# Patient Record
Sex: Male | Born: 1950 | Race: White | Hispanic: No | Marital: Married
Health system: Southern US, Community
[De-identification: ages and names within clinical notes are randomized; demographics above are authoritative.]

## PROBLEM LIST (undated history)

## (undated) DIAGNOSIS — B1920 Unspecified viral hepatitis C without hepatic coma: Secondary | ICD-10-CM

## (undated) DIAGNOSIS — K8020 Calculus of gallbladder without cholecystitis without obstruction: Secondary | ICD-10-CM

## (undated) HISTORY — PX: HERNIA REPAIR: SHX51

---

## 2009-12-15 ENCOUNTER — Emergency Department (HOSPITAL_COMMUNITY): Admission: EM | Admit: 2009-12-15 | Discharge: 2009-12-15 | Payer: Self-pay | Admitting: Emergency Medicine

## 2010-10-30 ENCOUNTER — Ambulatory Visit: Payer: Self-pay | Admitting: Pain Medicine

## 2010-11-21 ENCOUNTER — Ambulatory Visit: Payer: Self-pay | Admitting: Pain Medicine

## 2012-01-23 ENCOUNTER — Ambulatory Visit: Payer: Self-pay | Admitting: Family Medicine

## 2012-02-06 ENCOUNTER — Ambulatory Visit: Payer: Self-pay | Admitting: Family Medicine

## 2012-03-29 ENCOUNTER — Ambulatory Visit: Payer: Self-pay | Admitting: Family Medicine

## 2012-04-29 ENCOUNTER — Ambulatory Visit: Payer: Self-pay | Admitting: Family Medicine

## 2014-09-13 ENCOUNTER — Emergency Department (HOSPITAL_COMMUNITY): Payer: Medicare HMO

## 2014-09-13 ENCOUNTER — Encounter (HOSPITAL_COMMUNITY): Payer: Self-pay | Admitting: Emergency Medicine

## 2014-09-13 ENCOUNTER — Emergency Department (HOSPITAL_COMMUNITY)
Admission: EM | Admit: 2014-09-13 | Discharge: 2014-09-13 | Disposition: A | Discharge status: Home / Self Care | Payer: Medicare HMO | Attending: Emergency Medicine | Admitting: Emergency Medicine

## 2014-09-13 DIAGNOSIS — R112 Nausea with vomiting, unspecified: Secondary | ICD-10-CM | POA: Diagnosis not present

## 2014-09-13 DIAGNOSIS — Z8719 Personal history of other diseases of the digestive system: Secondary | ICD-10-CM | POA: Diagnosis not present

## 2014-09-13 DIAGNOSIS — Z720 Tobacco use: Secondary | ICD-10-CM | POA: Insufficient documentation

## 2014-09-13 DIAGNOSIS — R1031 Right lower quadrant pain: Secondary | ICD-10-CM

## 2014-09-13 DIAGNOSIS — R110 Nausea: Secondary | ICD-10-CM

## 2014-09-13 DIAGNOSIS — Z8619 Personal history of other infectious and parasitic diseases: Secondary | ICD-10-CM | POA: Diagnosis not present

## 2014-09-13 DIAGNOSIS — M549 Dorsalgia, unspecified: Secondary | ICD-10-CM | POA: Diagnosis not present

## 2014-09-13 DIAGNOSIS — Z79899 Other long term (current) drug therapy: Secondary | ICD-10-CM | POA: Diagnosis not present

## 2014-09-13 DIAGNOSIS — R000 Tachycardia, unspecified: Secondary | ICD-10-CM | POA: Diagnosis not present

## 2014-09-13 HISTORY — DX: Unspecified viral hepatitis C without hepatic coma: B19.20

## 2014-09-13 HISTORY — DX: Calculus of gallbladder without cholecystitis without obstruction: K80.20

## 2014-09-13 LAB — COMPREHENSIVE METABOLIC PANEL
ALK PHOS: 137 U/L — AB (ref 38–126)
ALT: 65 U/L — ABNORMAL HIGH (ref 17–63)
ANION GAP: 11 (ref 5–15)
AST: 73 U/L — ABNORMAL HIGH (ref 15–41)
Albumin: 4.2 g/dL (ref 3.5–5.0)
BILIRUBIN TOTAL: 1.9 mg/dL — AB (ref 0.3–1.2)
BUN: 11 mg/dL (ref 6–20)
CHLORIDE: 99 mmol/L — AB (ref 101–111)
CO2: 25 mmol/L (ref 22–32)
CREATININE: 1.01 mg/dL (ref 0.61–1.24)
Calcium: 9.2 mg/dL (ref 8.9–10.3)
GFR calc Af Amer: >60 mL/min (ref >60)
GLUCOSE: 140 mg/dL — AB (ref 65–99)
POTASSIUM: 3.8 mmol/L (ref 3.5–5.1)
Sodium: 135 mmol/L (ref 135–145)
TOTAL PROTEIN: 7.2 g/dL (ref 6.5–8.1)

## 2014-09-13 LAB — CBC WITH DIFFERENTIAL/PLATELET
Basophils Absolute: 0 10*3/uL (ref 0.0–0.1)
Basophils Relative: 0 % (ref 0–1)
EOS PCT: 0 % (ref 0–5)
Eosinophils Absolute: 0 10*3/uL (ref 0.0–0.7)
HEMATOCRIT: 46.6 % (ref 39.0–52.0)
HEMOGLOBIN: 16.5 g/dL (ref 13.0–17.0)
LYMPHS ABS: 3.4 10*3/uL (ref 0.7–4.0)
Lymphocytes Relative: 28 % (ref 12–46)
MCH: 33.1 pg (ref 26.0–34.0)
MCHC: 35.4 g/dL (ref 30.0–36.0)
MCV: 93.6 fL (ref 78.0–100.0)
MONO ABS: 1.1 10*3/uL — AB (ref 0.1–1.0)
Monocytes Relative: 9 % (ref 3–12)
NEUTROS PCT: 63 % (ref 43–77)
Neutro Abs: 7.6 10*3/uL (ref 1.7–7.7)
Platelets: 160 10*3/uL (ref 150–400)
RBC: 4.98 MIL/uL (ref 4.22–5.81)
RDW: 12.7 % (ref 11.5–15.5)
WBC: 12.1 10*3/uL — AB (ref 4.0–10.5)

## 2014-09-13 LAB — URINALYSIS, ROUTINE W REFLEX MICROSCOPIC
Glucose, UA: NEGATIVE mg/dL
Hgb urine dipstick: NEGATIVE
KETONES UR: 15 mg/dL — AB
Leukocytes, UA: NEGATIVE
NITRITE: NEGATIVE
PROTEIN: NEGATIVE mg/dL
Specific Gravity, Urine: 1.028 (ref 1.005–1.030)
Urobilinogen, UA: 2 mg/dL — ABNORMAL HIGH (ref 0.0–1.0)
pH: 6 (ref 5.0–8.0)

## 2014-09-13 LAB — LIPASE, BLOOD: LIPASE: 37 U/L (ref 22–51)

## 2014-09-13 MED ORDER — IOHEXOL 300 MG/ML  SOLN
25.0000 mL | Freq: Once | INTRAMUSCULAR | Status: AC | PRN
  Administered 2014-09-13: 25 mL via ORAL

## 2014-09-13 MED ORDER — HYDROMORPHONE HCL 1 MG/ML IJ SOLN
1.0000 mg | Freq: Once | INTRAMUSCULAR | Status: AC
  Administered 2014-09-13: 1 mg via INTRAVENOUS
  Filled 2014-09-13: qty 1

## 2014-09-13 MED ORDER — HYDROMORPHONE HCL 1 MG/ML IJ SOLN
1.0000 mg | Freq: Once | INTRAMUSCULAR | Status: DC
Start: 2014-09-13 — End: 2014-09-13

## 2014-09-13 MED ORDER — ONDANSETRON HCL 4 MG/2ML IJ SOLN
4.0000 mg | Freq: Once | INTRAMUSCULAR | Status: AC
  Administered 2014-09-13: 4 mg via INTRAVENOUS
  Filled 2014-09-13: qty 2

## 2014-09-13 MED ORDER — MORPHINE SULFATE 4 MG/ML IJ SOLN
4.0000 mg | Freq: Once | INTRAMUSCULAR | Status: AC
  Administered 2014-09-13: 4 mg via INTRAVENOUS
  Filled 2014-09-13: qty 1

## 2014-09-13 MED ORDER — OXYCODONE-ACETAMINOPHEN 5-325 MG PO TABS: 2.0000 | ORAL_TABLET | ORAL | Status: AC | PRN

## 2014-09-13 MED ORDER — OXYCODONE-ACETAMINOPHEN 5-325 MG PO TABS
2.0000 | ORAL_TABLET | Freq: Once | ORAL | Status: AC
  Administered 2014-09-13: 2 via ORAL
  Filled 2014-09-13: qty 2

## 2014-09-13 MED ORDER — ONDANSETRON 4 MG PO TBDP: 4.0000 mg | ORAL_TABLET | Freq: Three times a day (TID) | ORAL | Status: AC | PRN

## 2014-09-13 MED ORDER — SODIUM CHLORIDE 0.9 % IV BOLUS (SEPSIS)
1000.0000 mL | Freq: Once | INTRAVENOUS | Status: AC
  Administered 2014-09-13: 1000 mL via INTRAVENOUS

## 2014-09-13 MED ORDER — IOHEXOL 300 MG/ML  SOLN
100.0000 mL | Freq: Once | INTRAMUSCULAR | Status: AC | PRN
  Administered 2014-09-13: 100 mL via INTRAVENOUS

## 2014-09-13 NOTE — Discharge Instructions (Signed)
Take Percocet as needed for pain. Take zofran as needed for nausea. Follow up with your doctor for further evaluation. Return to the ED with worsening or concerning symptoms.

## 2014-09-13 NOTE — ED Provider Notes (Signed)
CSN: [id number]     Arrival date & time 09/13/14  0506 History   First MD Initiated Contact with Patient 09/13/14 0602     Chief Complaint  Patient presents with  . Abdominal Pain     (Consider location/radiation/quality/duration/timing/severity/associated sxs/prior Treatment) HPI Comments: Patient is a 64 year old male with a past medical history of hepatitis C and gallstones who presents with abdominal pain for the past 3 days. The pain is located in the RLQ and radiates to his right low back. The pain is described as aching and severe. The pain started gradually and progressively worsened since the onset. No alleviating/aggravating factors. The patient has tried nothing for symptoms without relief. Associated symptoms include nausea and vomiting. Patient denies headache, diarrhea, chest pain, SOB, dysuria, constipation. Previous abdominal surgery includes hernia repair.     Past Medical History  Diagnosis Date  . Hepatitis C   . Gallstones    Past Surgical History  Procedure Laterality Date  . Hernia repair     No family history on file. History  Substance Use Topics  . Smoking status: Current Every Day Smoker  . Smokeless tobacco: Not on file  . Alcohol Use: No    Review of Systems  Constitutional: Negative for fever, chills and fatigue.  HENT: Negative for trouble swallowing.   Eyes: Negative for visual disturbance.  Respiratory: Negative for shortness of breath.   Cardiovascular: Negative for chest pain and palpitations.  Gastrointestinal: Positive for nausea, vomiting and abdominal pain. Negative for diarrhea.  Genitourinary: Negative for dysuria and difficulty urinating.  Musculoskeletal: Positive for back pain. Negative for arthralgias and neck pain.  Skin: Negative for color change.  Neurological: Negative for dizziness and weakness.  Psychiatric/Behavioral: Negative for dysphoric mood.      Allergies  Review of patient's allergies indicates no known  allergies.  Home Medications   Prior to Admission medications   Medication Sig Start Date End Date Taking? Authorizing Provider  lactulose (CHRONULAC) 10 GM/15ML solution Take 20 g by mouth 2 (two) times daily.   Yes Historical Provider, MD   BP 195/89 mmHg  Pulse 105  Temp(Src) 98.2 F (36.8 C) (Oral)  Resp 20  SpO2 94% Physical Exam  Constitutional: He is oriented to person, place, and time. He appears well-developed and well-nourished. No distress.  HENT:  Head: Normocephalic and atraumatic.  Eyes: Conjunctivae and EOM are normal.  Neck: Normal range of motion.  Cardiovascular: Regular rhythm.  Exam reveals no gallop and no friction rub.   No murmur heard. tachycardic  Pulmonary/Chest: Effort normal and breath sounds normal. He has no wheezes. He has no rales. He exhibits no tenderness.  Abdominal: Soft. He exhibits no distension. There is tenderness. There is no rebound.  RLQ tenderness to palpation. No peritoneal signs. No other focal tenderness to palpation.   Musculoskeletal: Normal range of motion.  Neurological: He is alert and oriented to person, place, and time. Coordination normal.  Speech is goal-oriented. Moves limbs without ataxia.   Skin: Skin is warm and dry.  Psychiatric: He has a normal mood and affect. His behavior is normal.  Nursing note and vitals reviewed.   ED Course  Procedures (including critical care time) Labs Review Labs Reviewed  CBC WITH DIFFERENTIAL/PLATELET - Abnormal; Notable for the following:    WBC 12.1 (*)    Monocytes Absolute 1.1 (*)    All other components within normal limits  COMPREHENSIVE METABOLIC PANEL - Abnormal; Notable for the following:    Chloride  99 (*)    Glucose, Bld 140 (*)    AST 73 (*)    ALT 65 (*)    Alkaline Phosphatase 137 (*)    Total Bilirubin 1.9 (*)    All other components within normal limits  URINALYSIS, ROUTINE W REFLEX MICROSCOPIC - Abnormal; Notable for the following:    Color, Urine AMBER (*)     Bilirubin Urine SMALL (*)    Ketones, ur 15 (*)    Urobilinogen, UA 2.0 (*)    All other components within normal limits  LIPASE, BLOOD    Imaging Review Ct Abdomen Pelvis W Contrast  09/13/2014   CLINICAL DATA:  Low abdominal pain/low back pain.  Right flank pain  EXAM: CT ABDOMEN AND PELVIS WITH CONTRAST  TECHNIQUE: Multidetector CT imaging of the abdomen and pelvis was performed using the standard protocol following bolus administration of intravenous contrast.  CONTRAST:  100mL OMNIPAQUE IOHEXOL 300 MG/ML  SOLN  COMPARISON:  None.  FINDINGS: Lower chest: The lung bases are clear. There is no pleural or pericardial effusion identified.  Hepatobiliary: No focal liver abnormality identified. There is mild to moderate intrahepatic and extrahepatic bile duct dilatation. The common bile duct measures 11 mm. No obstructing stone identified.  Pancreas: Increase caliber of the pancreatic duct which measures up to 6 mm, image 31/ series 201. Soft tissue attenuating structure at the level of the ampulla is identified measuring 1.2 x 1.7 cm.  Spleen: Negative  Adrenals/Urinary Tract: Normal appearance of the adrenal glands. The right kidney is normal. There is a cyst in the left kidney which measures 2 cm, image 32 of series 201. The urinary bladder is within normal limits.  Stomach/Bowel: The stomach is within normal limits. The small bowel loops have a normal course and caliber. No obstruction. Normal appearance of the colon. The appendix is visualized and appears normal. Normal appearance of the colon.  Vascular/Lymphatic: Calcified atherosclerotic disease involves the abdominal aorta. No aneurysm. Prominent portal caval lymph node measures 1.3 cm short axis, image 26 of series 201. No pelvic or inguinal adenopathy.  Reproductive: Prostate gland and seminal vesicles are unremarkable.  Other: There is no ascites or focal fluid collections within the abdomen or pelvis.  Musculoskeletal: Review of the visualized  osseous structures is negative for aggressive lytic or sclerotic bone lesion. Degenerate degenerative disc disease is noted at the L5-S1 level.  IMPRESSION: 1. There is increase caliber of the intrahepatic and extrahepatic bile ducts. The pancreatic duct is also increased in caliber. No stone identified. Lesion at the level of the ampulla cannot be excluded. Further evaluation with contrast enhanced MRCP and/or ERCP is recommended. 2. Aortic atherosclerosis.   Electronically Signed   By: Signa Kellaylor  Stroud M.D.   On: 09/13/2014 08:23     EKG Interpretation None      MDM   Final diagnoses:  RLQ abdominal pain  Nausea   6:23 AM Labs, urinalysis, and CT abdomen pending. Patient given morphine and zofran for symptoms. Patient mildly tachycardic with remaining vitals stable. No fever.   12:19 PM No acute changes on CT. Patient discussed with Dr. Freida BusmanAllen. Patient offered admission for pain control but he declines. Patient instructed to return to the ED with worsening or concerning symptoms. Patient will be discharged with Percocet and zofran for symptoms.   Emilia BeckKaitlyn Purvis Sidle, PA-C 09/13/14 1221  Shon Batonourtney F Horton, MD 09/13/14 [phone]

## 2014-09-13 NOTE — ED Notes (Signed)
Pt. arrived with EMS from home reports low abdominal pain/low back pain with mild SOB and nausea onset 3 days ago , denies diarrhea , no fever or chills. Pt. received Fentanyl 50 mcg and Zofran 4 mg IV prior to arrival with temporary relief.

## 2016-05-05 ENCOUNTER — Encounter (INDEPENDENT_AMBULATORY_CARE_PROVIDER_SITE_OTHER): Payer: Self-pay | Admitting: Ophthalmology

## 2019-11-22 ENCOUNTER — Other Ambulatory Visit: Payer: Self-pay

## 2019-11-22 ENCOUNTER — Emergency Department (HOSPITAL_COMMUNITY): Payer: Medicare Other

## 2019-11-22 ENCOUNTER — Inpatient Hospital Stay (HOSPITAL_COMMUNITY)
Admission: EM | Admit: 2019-11-22 | Discharge: 2019-11-28 | DRG: 964 | Disposition: A | Discharge status: Home Health | Payer: Medicare Other | Attending: Emergency Medicine | Admitting: Emergency Medicine

## 2019-11-22 DIAGNOSIS — S42101A Fracture of unspecified part of scapula, right shoulder, initial encounter for closed fracture: Secondary | ICD-10-CM

## 2019-11-22 DIAGNOSIS — S2241XA Multiple fractures of ribs, right side, initial encounter for closed fracture: Secondary | ICD-10-CM

## 2019-11-22 DIAGNOSIS — S270XXA Traumatic pneumothorax, initial encounter: Secondary | ICD-10-CM

## 2019-11-22 DIAGNOSIS — J939 Pneumothorax, unspecified: Secondary | ICD-10-CM

## 2019-11-22 DIAGNOSIS — S0240CA Maxillary fracture, right side, initial encounter for closed fracture: Secondary | ICD-10-CM | POA: Diagnosis present

## 2019-11-22 DIAGNOSIS — S020XXA Fracture of vault of skull, initial encounter for closed fracture: Secondary | ICD-10-CM | POA: Diagnosis present

## 2019-11-22 DIAGNOSIS — D649 Anemia, unspecified: Secondary | ICD-10-CM | POA: Diagnosis present

## 2019-11-22 DIAGNOSIS — Z4682 Encounter for fitting and adjustment of non-vascular catheter: Secondary | ICD-10-CM

## 2019-11-22 DIAGNOSIS — D6959 Other secondary thrombocytopenia: Secondary | ICD-10-CM | POA: Diagnosis present

## 2019-11-22 DIAGNOSIS — S064X9A Epidural hemorrhage with loss of consciousness of unspecified duration, initial encounter: Secondary | ICD-10-CM | POA: Diagnosis present

## 2019-11-22 DIAGNOSIS — Z20822 Contact with and (suspected) exposure to covid-19: Secondary | ICD-10-CM | POA: Diagnosis present

## 2019-11-22 DIAGNOSIS — S0240EA Zygomatic fracture, right side, initial encounter for closed fracture: Secondary | ICD-10-CM | POA: Diagnosis present

## 2019-11-22 DIAGNOSIS — S0219XA Other fracture of base of skull, initial encounter for closed fracture: Secondary | ICD-10-CM | POA: Diagnosis present

## 2019-11-22 DIAGNOSIS — F1110 Opioid abuse, uncomplicated: Secondary | ICD-10-CM | POA: Diagnosis present

## 2019-11-22 DIAGNOSIS — G8929 Other chronic pain: Secondary | ICD-10-CM | POA: Diagnosis present

## 2019-11-22 DIAGNOSIS — R791 Abnormal coagulation profile: Secondary | ICD-10-CM | POA: Diagnosis present

## 2019-11-22 DIAGNOSIS — I10 Essential (primary) hypertension: Secondary | ICD-10-CM | POA: Diagnosis present

## 2019-11-22 DIAGNOSIS — F17210 Nicotine dependence, cigarettes, uncomplicated: Secondary | ICD-10-CM | POA: Diagnosis present

## 2019-11-22 DIAGNOSIS — R0602 Shortness of breath: Secondary | ICD-10-CM

## 2019-11-22 DIAGNOSIS — S42111A Displaced fracture of body of scapula, right shoulder, initial encounter for closed fracture: Secondary | ICD-10-CM | POA: Diagnosis present

## 2019-11-22 DIAGNOSIS — E875 Hyperkalemia: Secondary | ICD-10-CM | POA: Diagnosis present

## 2019-11-22 DIAGNOSIS — S27321A Contusion of lung, unilateral, initial encounter: Secondary | ICD-10-CM | POA: Diagnosis present

## 2019-11-22 DIAGNOSIS — S43101A Unspecified dislocation of right acromioclavicular joint, initial encounter: Secondary | ICD-10-CM | POA: Diagnosis present

## 2019-11-22 DIAGNOSIS — N179 Acute kidney failure, unspecified: Secondary | ICD-10-CM | POA: Diagnosis present

## 2019-11-22 DIAGNOSIS — S7001XA Contusion of right hip, initial encounter: Secondary | ICD-10-CM | POA: Diagnosis present

## 2019-11-22 DIAGNOSIS — K7460 Unspecified cirrhosis of liver: Secondary | ICD-10-CM | POA: Diagnosis present

## 2019-11-22 DIAGNOSIS — M25571 Pain in right ankle and joints of right foot: Secondary | ICD-10-CM

## 2019-11-22 DIAGNOSIS — Z9689 Presence of other specified functional implants: Secondary | ICD-10-CM

## 2019-11-22 LAB — I-STAT CHEM 8, ED
BUN: 45 mg/dL — ABNORMAL HIGH (ref 8–23)
Calcium, Ion: 0.99 mmol/L — ABNORMAL LOW (ref 1.15–1.40)
Chloride: 107 mmol/L (ref 98–111)
Creatinine, Ser: 1.5 mg/dL — ABNORMAL HIGH (ref 0.61–1.24)
Glucose, Bld: 134 mg/dL — ABNORMAL HIGH (ref 70–99)
HCT: 37 % — ABNORMAL LOW (ref 39.0–52.0)
Hemoglobin: 12.6 g/dL — ABNORMAL LOW (ref 13.0–17.0)
Potassium: 5.7 mmol/L — ABNORMAL HIGH (ref 3.5–5.1)
Sodium: 139 mmol/L (ref 135–145)
TCO2: 23 mmol/L (ref 22–32)

## 2019-11-22 LAB — PROTIME-INR
INR: 2.7 — ABNORMAL HIGH (ref 0.8–1.2)
Prothrombin Time: 27.6 seconds — ABNORMAL HIGH (ref 11.4–15.2)

## 2019-11-22 LAB — CBC
HCT: 38.4 % — ABNORMAL LOW (ref 39.0–52.0)
Hemoglobin: 12.7 g/dL — ABNORMAL LOW (ref 13.0–17.0)
MCH: 32.6 pg (ref 26.0–34.0)
MCHC: 33.1 g/dL (ref 30.0–36.0)
MCV: 98.5 fL (ref 80.0–100.0)
Platelets: 121 10*3/uL — ABNORMAL LOW (ref 150–400)
RBC: 3.9 MIL/uL — ABNORMAL LOW (ref 4.22–5.81)
RDW: 12.4 % (ref 11.5–15.5)
WBC: 12.2 10*3/uL — ABNORMAL HIGH (ref 4.0–10.5)
nRBC: 0 % (ref 0.0–0.2)

## 2019-11-22 LAB — SAMPLE TO BLOOD BANK

## 2019-11-22 LAB — LACTIC ACID, PLASMA: Lactic Acid, Venous: 0.6 mmol/L (ref 0.5–1.9)

## 2019-11-22 LAB — ETHANOL: Alcohol, Ethyl (B): <10 mg/dL (ref <10)

## 2019-11-22 MED ORDER — IOHEXOL 300 MG/ML  SOLN
100.0000 mL | Freq: Once | INTRAMUSCULAR | Status: AC | PRN
  Administered 2019-11-22: 100 mL via INTRAVENOUS

## 2019-11-22 MED ORDER — LIDOCAINE HCL (PF) 1 % IJ SOLN
INTRAMUSCULAR | Status: AC
  Filled 2019-11-22: qty 5

## 2019-11-22 MED ORDER — KETAMINE HCL 50 MG/5ML IJ SOSY
PREFILLED_SYRINGE | INTRAMUSCULAR | Status: AC
  Filled 2019-11-22: qty 5

## 2019-11-22 MED ORDER — HYDROMORPHONE HCL 1 MG/ML IJ SOLN
0.5000 mg | Freq: Once | INTRAMUSCULAR | Status: AC
  Administered 2019-11-22: 0.5 mg via INTRAVENOUS
  Filled 2019-11-22: qty 1
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
## 2019-11-23 ENCOUNTER — Inpatient Hospital Stay (HOSPITAL_COMMUNITY): Payer: Medicare Other

## 2019-11-23 ENCOUNTER — Emergency Department (HOSPITAL_COMMUNITY): Payer: Medicare Other

## 2019-11-23 DIAGNOSIS — K7460 Unspecified cirrhosis of liver: Secondary | ICD-10-CM | POA: Diagnosis present

## 2019-11-23 DIAGNOSIS — G8929 Other chronic pain: Secondary | ICD-10-CM | POA: Diagnosis present

## 2019-11-23 DIAGNOSIS — R791 Abnormal coagulation profile: Secondary | ICD-10-CM | POA: Diagnosis present

## 2019-11-23 DIAGNOSIS — Z20822 Contact with and (suspected) exposure to covid-19: Secondary | ICD-10-CM | POA: Diagnosis present

## 2019-11-23 DIAGNOSIS — S0240EA Zygomatic fracture, right side, initial encounter for closed fracture: Secondary | ICD-10-CM | POA: Diagnosis present

## 2019-11-23 DIAGNOSIS — F17210 Nicotine dependence, cigarettes, uncomplicated: Secondary | ICD-10-CM | POA: Diagnosis present

## 2019-11-23 DIAGNOSIS — S020XXA Fracture of vault of skull, initial encounter for closed fracture: Secondary | ICD-10-CM | POA: Diagnosis present

## 2019-11-23 DIAGNOSIS — E875 Hyperkalemia: Secondary | ICD-10-CM | POA: Diagnosis present

## 2019-11-23 DIAGNOSIS — S0240CA Maxillary fracture, right side, initial encounter for closed fracture: Secondary | ICD-10-CM | POA: Diagnosis present

## 2019-11-23 DIAGNOSIS — D649 Anemia, unspecified: Secondary | ICD-10-CM | POA: Diagnosis present

## 2019-11-23 DIAGNOSIS — S270XXA Traumatic pneumothorax, initial encounter: Secondary | ICD-10-CM | POA: Diagnosis present

## 2019-11-23 DIAGNOSIS — S2241XA Multiple fractures of ribs, right side, initial encounter for closed fracture: Secondary | ICD-10-CM | POA: Diagnosis present

## 2019-11-23 DIAGNOSIS — S43101A Unspecified dislocation of right acromioclavicular joint, initial encounter: Secondary | ICD-10-CM | POA: Diagnosis present

## 2019-11-23 DIAGNOSIS — F1110 Opioid abuse, uncomplicated: Secondary | ICD-10-CM | POA: Diagnosis present

## 2019-11-23 DIAGNOSIS — S42111A Displaced fracture of body of scapula, right shoulder, initial encounter for closed fracture: Secondary | ICD-10-CM | POA: Diagnosis present

## 2019-11-23 DIAGNOSIS — D6959 Other secondary thrombocytopenia: Secondary | ICD-10-CM | POA: Diagnosis present

## 2019-11-23 DIAGNOSIS — S27321A Contusion of lung, unilateral, initial encounter: Secondary | ICD-10-CM | POA: Diagnosis present

## 2019-11-23 DIAGNOSIS — S064X9A Epidural hemorrhage with loss of consciousness of unspecified duration, initial encounter: Secondary | ICD-10-CM | POA: Diagnosis present

## 2019-11-23 DIAGNOSIS — N179 Acute kidney failure, unspecified: Secondary | ICD-10-CM | POA: Diagnosis present

## 2019-11-23 DIAGNOSIS — S0219XA Other fracture of base of skull, initial encounter for closed fracture: Secondary | ICD-10-CM | POA: Diagnosis present

## 2019-11-23 DIAGNOSIS — S7001XA Contusion of right hip, initial encounter: Secondary | ICD-10-CM | POA: Diagnosis present

## 2019-11-23 DIAGNOSIS — I10 Essential (primary) hypertension: Secondary | ICD-10-CM | POA: Diagnosis present

## 2019-11-23 LAB — URINALYSIS, ROUTINE W REFLEX MICROSCOPIC
Bacteria, UA: NONE SEEN
Bilirubin Urine: NEGATIVE
Glucose, UA: 50 mg/dL — AB
Hgb urine dipstick: NEGATIVE
Ketones, ur: NEGATIVE mg/dL
Nitrite: NEGATIVE
Protein, ur: NEGATIVE mg/dL
Specific Gravity, Urine: >1.046 — ABNORMAL HIGH (ref 1.005–1.030)
pH: 5 (ref 5.0–8.0)

## 2019-11-23 LAB — BASIC METABOLIC PANEL
Anion gap: 11 (ref 5–15)
BUN: 30 mg/dL — ABNORMAL HIGH (ref 8–23)
CO2: 23 mmol/L (ref 22–32)
Calcium: 8.5 mg/dL — ABNORMAL LOW (ref 8.9–10.3)
Chloride: 104 mmol/L (ref 98–111)
Creatinine, Ser: 1.19 mg/dL (ref 0.61–1.24)
GFR calc Af Amer: >60 mL/min (ref >60)
GFR calc non Af Amer: >60 mL/min (ref >60)
Glucose, Bld: 152 mg/dL — ABNORMAL HIGH (ref 70–99)
Potassium: 4.5 mmol/L (ref 3.5–5.1)
Sodium: 138 mmol/L (ref 135–145)

## 2019-11-23 LAB — HEPATIC FUNCTION PANEL
ALT: 20 U/L (ref 0–44)
AST: 27 U/L (ref 15–41)
Albumin: 3.9 g/dL (ref 3.5–5.0)
Alkaline Phosphatase: 53 U/L (ref 38–126)
Bilirubin, Direct: <0.1 mg/dL (ref 0.0–0.2)
Total Bilirubin: 0.7 mg/dL (ref 0.3–1.2)
Total Protein: 6.9 g/dL (ref 6.5–8.1)

## 2019-11-23 LAB — TROPONIN I (HIGH SENSITIVITY)
Troponin I (High Sensitivity): 6 ng/L (ref <18)
Troponin I (High Sensitivity): 6 ng/L (ref <18)

## 2019-11-23 LAB — MRSA PCR SCREENING: MRSA by PCR: NEGATIVE

## 2019-11-23 LAB — RAPID URINE DRUG SCREEN, HOSP PERFORMED
Amphetamines: NOT DETECTED
Barbiturates: NOT DETECTED
Benzodiazepines: NOT DETECTED
Cocaine: NOT DETECTED
Opiates: NOT DETECTED
Tetrahydrocannabinol: NOT DETECTED

## 2019-11-23 LAB — PROTIME-INR
INR: 1.2 (ref 0.8–1.2)
Prothrombin Time: 14.6 seconds (ref 11.4–15.2)

## 2019-11-23 LAB — SARS CORONAVIRUS 2 BY RT PCR (HOSPITAL ORDER, PERFORMED IN ~~LOC~~ HOSPITAL LAB): SARS Coronavirus 2: NEGATIVE

## 2019-11-23 LAB — HIV ANTIBODY (ROUTINE TESTING W REFLEX): HIV Screen 4th Generation wRfx: NONREACTIVE

## 2019-11-23 MED ORDER — OXYCODONE HCL 5 MG PO TABS
5.0000 mg | ORAL_TABLET | ORAL | Status: DC | PRN
  Administered 2019-11-23 – 2019-11-26 (×8): 10 mg via ORAL
  Filled 2019-11-23 (×10): qty 2

## 2019-11-23 MED ORDER — VITAMIN K1 10 MG/ML IJ SOLN
1.0000 mg | Freq: Once | INTRAVENOUS | Status: AC
  Administered 2019-11-23: 1 mg via INTRAVENOUS
  Filled 2019-11-23: qty 0.1

## 2019-11-23 MED ORDER — HYDROMORPHONE HCL 1 MG/ML IJ SOLN
0.5000 mg | INTRAMUSCULAR | Status: DC | PRN
  Administered 2019-11-23 (×3): 2 mg via INTRAVENOUS
  Administered 2019-11-24: 1 mg via INTRAVENOUS
  Administered 2019-11-24 – 2019-11-26 (×9): 2 mg via INTRAVENOUS
  Filled 2019-11-23 (×3): qty 2
  Filled 2019-11-23: qty 1
  Filled 2019-11-23 (×9): qty 2

## 2019-11-23 MED ORDER — HYDROMORPHONE HCL 1 MG/ML IJ SOLN
0.5000 mg | Freq: Once | INTRAMUSCULAR | Status: AC
  Administered 2019-11-23: 0.5 mg via INTRAVENOUS
  Filled 2019-11-23: qty 1

## 2019-11-23 MED ORDER — METHOCARBAMOL 500 MG PO TABS
1000.0000 mg | ORAL_TABLET | Freq: Three times a day (TID) | ORAL | Status: DC
  Administered 2019-11-23 – 2019-11-24 (×4): 1000 mg via ORAL
  Filled 2019-11-23 (×11): qty 2

## 2019-11-23 MED ORDER — HYDROMORPHONE HCL 1 MG/ML IJ SOLN: 1.0000 mg | Freq: Once | INTRAMUSCULAR | Status: AC

## 2019-11-23 MED ORDER — ONDANSETRON 4 MG PO TBDP
4.0000 mg | ORAL_TABLET | Freq: Four times a day (QID) | ORAL | Status: DC | PRN
  Administered 2019-11-26: 4 mg via ORAL
  Filled 2019-11-23: qty 1

## 2019-11-23 MED ORDER — ACETAMINOPHEN 500 MG PO TABS
1000.0000 mg | ORAL_TABLET | Freq: Four times a day (QID) | ORAL | Status: DC
  Administered 2019-11-23: 1000 mg via ORAL
  Filled 2019-11-23 (×2): qty 2

## 2019-11-23 MED ORDER — METHADONE HCL 10 MG PO TABS
40.0000 mg | ORAL_TABLET | Freq: Every day | ORAL | Status: DC
  Administered 2019-11-23 – 2019-11-25 (×3): 40 mg via ORAL
  Filled 2019-11-23 (×2): qty 8
  Filled 2019-11-23: qty 4

## 2019-11-23 MED ORDER — TRAMADOL HCL 50 MG PO TABS
50.0000 mg | ORAL_TABLET | Freq: Four times a day (QID) | ORAL | Status: DC | PRN
  Administered 2019-11-23: 50 mg via ORAL
  Filled 2019-11-23 (×2): qty 1

## 2019-11-23 MED ORDER — DEXTROSE-NACL 5-0.9 % IV SOLN: INTRAVENOUS | Status: DC

## 2019-11-23 MED ORDER — CHLORHEXIDINE GLUCONATE CLOTH 2 % EX PADS
6.0000 | MEDICATED_PAD | Freq: Every day | CUTANEOUS | Status: DC
  Administered 2019-11-23 – 2019-11-28 (×6): 6 via TOPICAL

## 2019-11-23 MED ORDER — SODIUM CHLORIDE 0.9 % IV SOLN: 8.0000 mg | Freq: Once | INTRAVENOUS | Status: DC

## 2019-11-23 MED ORDER — ORAL CARE MOUTH RINSE
15.0000 mL | Freq: Two times a day (BID) | OROMUCOSAL | Status: DC
  Administered 2019-11-23 – 2019-11-28 (×7): 15 mL via OROMUCOSAL

## 2019-11-23 MED ORDER — LEVETIRACETAM 500 MG PO TABS
500.0000 mg | ORAL_TABLET | Freq: Two times a day (BID) | ORAL | Status: DC
  Administered 2019-11-23 – 2019-11-28 (×12): 500 mg via ORAL
  Filled 2019-11-23 (×12): qty 1

## 2019-11-23 MED ORDER — METOPROLOL TARTRATE 5 MG/5ML IV SOLN: 5.0000 mg | Freq: Four times a day (QID) | INTRAVENOUS | Status: DC | PRN

## 2019-11-23 MED ORDER — PANTOPRAZOLE SODIUM 40 MG PO TBEC: 40.0000 mg | DELAYED_RELEASE_TABLET | Freq: Every day | ORAL | Status: DC

## 2019-11-23 MED ORDER — PANTOPRAZOLE SODIUM 40 MG IV SOLR: 40.0000 mg | Freq: Every day | INTRAVENOUS | Status: DC

## 2019-11-23 MED ORDER — ONDANSETRON HCL 4 MG/2ML IJ SOLN
4.0000 mg | Freq: Four times a day (QID) | INTRAMUSCULAR | Status: DC | PRN
  Administered 2019-11-23 – 2019-11-24 (×3): 4 mg via INTRAVENOUS
  Filled 2019-11-23 (×3): qty 2

## 2019-11-23 MED ORDER — HEPARIN SODIUM (PORCINE) 5000 UNIT/ML IJ SOLN: 5000.0000 [IU] | Freq: Three times a day (TID) | INTRAMUSCULAR | Status: DC

## 2019-11-23 MED ORDER — HYDROMORPHONE HCL 1 MG/ML IJ SOLN
INTRAMUSCULAR | Status: AC
  Administered 2019-11-23: 1 mg via INTRAVENOUS
  Filled 2019-11-23: qty 1

## 2019-11-23 MED ORDER — ONDANSETRON HCL 4 MG/2ML IJ SOLN
4.0000 mg | Freq: Once | INTRAMUSCULAR | Status: AC
  Administered 2019-11-23: 4 mg via INTRAVENOUS

## 2019-11-23 MED ORDER — DOCUSATE SODIUM 100 MG PO CAPS
100.0000 mg | ORAL_CAPSULE | Freq: Two times a day (BID) | ORAL | Status: DC
  Administered 2019-11-23 – 2019-11-28 (×11): 100 mg via ORAL
  Filled 2019-11-23 (×11): qty 1

## 2019-11-23 MED ORDER — ENOXAPARIN SODIUM 30 MG/0.3ML ~~LOC~~ SOLN
30.0000 mg | Freq: Two times a day (BID) | SUBCUTANEOUS | Status: DC
  Administered 2019-11-24 – 2019-11-28 (×9): 30 mg via SUBCUTANEOUS
  Filled 2019-11-23 (×9): qty 0.3

## 2019-11-23 MED ORDER — HYDRALAZINE HCL 20 MG/ML IJ SOLN
10.0000 mg | INTRAMUSCULAR | Status: DC | PRN
  Administered 2019-11-23 – 2019-11-24 (×2): 10 mg via INTRAVENOUS
  Filled 2019-11-23 (×2): qty 1

## 2019-11-23 MED ORDER — ONDANSETRON HCL 4 MG/2ML IJ SOLN
4.0000 mg | Freq: Once | INTRAMUSCULAR | Status: AC
  Administered 2019-11-23: 4 mg via INTRAVENOUS
  Filled 2019-11-23: qty 2

## 2019-11-23 MED ORDER — SODIUM CHLORIDE 0.9 % IV SOLN: INTRAVENOUS | Status: DC

## 2019-11-23 MED ORDER — HYDROMORPHONE HCL 1 MG/ML IJ SOLN
1.0000 mg | Freq: Once | INTRAMUSCULAR | Status: AC
  Administered 2019-11-23: 1 mg via INTRAVENOUS
  Filled 2019-11-23: qty 1
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
## 2019-11-23 NOTE — ED Notes (Signed)
Report given to [facility]. Stated to take the patient to CT before going up. CT stated they will take patient now.
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
## 2019-11-23 NOTE — Consult Note (Signed)
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 role for NS intervention at present - q 2 hour neuro checks - repeat head CT in 6 hours for monitoring, sooner as indicated by exam - Keppra 500mg  BID x7days for routine seizure prophlaxis   , PA-C [facility]

## 2019-11-23 NOTE — Progress Notes (Signed)
Assisted tele visit to patient with provider. Dr Lovick Trauma MD Shashwat Cleary M, RN  

## 2019-11-23 NOTE — Progress Notes (Addendum)
Patient seen via eLink.  [facility]  Right PTX - R pigtail placed 8/4, on suction Right rib fractures (1-6th posterior and lateral 5-7) and pulmonary contusion - pain control, IS/pulm toilet Right scapula fracture and g2 AC separation - ortho c/s, Dr. Aundria Rud Right parietal fx with punctate hemorrhagic contusion - NSGY c/s, repeat CT head stable, keppra x7d for sz ppx, SLP for TBI thx, monitor neuro exam Right temporal bone fx - nonop management Zygomaticomaxillary fx - ENT c/s, Dr. Pollyann Kennedy, plan pending  Elevated INR - likely related to cirrhosis, no reversal planned, continue to monitor Acute kidney injury - improving and GFR normal, hydrate, recheck in AM Right hip hematoma - warm compresses H/o hepatitis C - monitor liver function Thrombocytopenia - likely chronic, monitor Chronic pain - home methadone restarted, also has scheduled and PRN pain meds available FEN - regular diet, hyperkalemia downtrending, continue to monitor, remove dextrose from fluids in the setting of head injury DVT - SCDs, LMWH to start in AM Dispo - 4NP, PT/OT   Diamantina Monks, MD General and Trauma Surgery [facility]

## 2019-11-23 NOTE — Evaluation (Signed)
Physical Therapy Evaluation Patient Details Name: Logan Berg MRN: [id number] DOB: 1950-07-22 Today's Date: 11/23/2019   History of Present Illness  69 y.o. male involved in [facility] found to have R rib fractures (1-7), PTX, epidural hemorrhage and skull fx. Pt underwent chest tube placement on 11/23/19. PMH includes cirrhosis and hepatitis C.  Clinical Impression  Pt presents to PT with deficits in functional mobility, gait, balance, endurance, strength, power, and activity tolerance. Pt is significantly limited by pain at this time, refusing to attempt OOB mobility and with labored bed mobility. Pt demonstrates good LE strength at bed level and PT anticipates the pt will be able to ambulate and transfer well if pain can be controlled (RN also reports pt transferred to bedside commode earlier in the day). Pt will benefit from continued acute PT POC to improve mobility quality and activity tolerance.    Follow Up Recommendations Home health PT;Supervision for mobility/OOB    Equipment Recommendations  3in1 (PT)    Recommendations for Other Services       Precautions / Restrictions Precautions Precautions: Fall Precaution Comments: R chest tube Restrictions Weight Bearing Restrictions: No      Mobility  Bed Mobility Overal bed mobility: Needs Assistance Bed Mobility: Supine to Sit;Sit to Supine     Supine to sit: Mod assist Sit to supine: Mod assist      Transfers                 General transfer comment: pt declines attempts at transferring due to pain  Ambulation/Gait                Stairs            Wheelchair Mobility    Modified Rankin (Stroke Patients Only)       Balance Overall balance assessment: Needs assistance Sitting-balance support: No upper extremity supported;Feet supported Sitting balance-Leahy Scale: Fair Sitting balance - Comments: supervision                                     Pertinent Vitals/Pain Pain  Assessment: Faces Faces Pain Scale: Hurts whole lot Pain Location: back Pain Descriptors / Indicators: Grimacing Pain Intervention(s): Monitored during session    Home Living Family/patient expects to be discharged to:: Private residence Living Arrangements: Spouse/significant other Available Help at Discharge: Family;Available PRN/intermittently Type of Home: House Home Access: Stairs to enter Entrance Stairs-Rails: Can reach both Entrance Stairs-Number of Steps: 4 Home Layout: One level Home Equipment: Cane - single point      Prior Function Level of Independence: Independent         Comments: driving motorcycle, 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 but 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     Higher education careers adviser        Extremity/Trunk Assessment   Upper Extremity Assessment Upper Extremity Assessment: RUE deficits/detail;LUE deficits/detail RUE Deficits / Details: in sling, ROM and strength assessment deferred LUE Deficits / Details: strength grossly limited due to pain    Lower Extremity Assessment Lower Extremity Assessment: Generalized weakness    Cervical / Trunk Assessment Cervical / Trunk Assessment: Normal  Communication   Communication: No difficulties  Cognition Arousal/Alertness: Awake/alert Behavior During Therapy: Flat affect Overall Cognitive Status: Within Functional Limits for tasks assessed  General Comments General comments (skin integrity, edema, etc.): VSS on 4L [place], RR elevated into 30-40s throughout mobility    Exercises     Assessment/Plan    PT Assessment Patient needs continued PT services  PT Problem List Decreased strength;Decreased activity tolerance;Decreased balance;Decreased mobility;Decreased knowledge of use of DME;Decreased safety awareness;Decreased knowledge of precautions;Pain       PT Treatment Interventions DME instruction;Gait training;Stair training;Functional mobility training;Therapeutic  activities;Therapeutic exercise;Balance training;Neuromuscular re-education;Patient/family education    PT Goals (Current goals can be found in the Care Plan section)  Acute Rehab PT Goals Patient Stated Goal: To reduce pain and improve mobility PT Goal Formulation: With patient Time For Goal Achievement: 12/07/19 Potential to Achieve Goals: Good    Frequency Min 5X/week   Barriers to discharge        Co-evaluation               AM-PAC PT "6 Clicks" Mobility  Outcome Measure Help needed turning from your back to your side while in a flat bed without using bedrails?: A Lot Help needed moving from lying on your back to sitting on the side of a flat bed without using bedrails?: A Lot Help needed moving to and from a bed to a chair (including a wheelchair)?: A Lot Help needed standing up from a chair using your arms (e.g., wheelchair or bedside chair)?: A Lot Help needed to walk in hospital room?: A Lot Help needed climbing 3-5 steps with a railing? : Total 6 Click Score: 11    End of Session Equipment Utilized During Treatment: Other (comment);Oxygen (sling) Activity Tolerance: Patient limited by pain Patient left: in bed;with call bell/phone within reach;with bed alarm set Nurse Communication: Mobility status PT Visit Diagnosis: Other abnormalities of gait and mobility (R26.89);Muscle weakness (generalized) (M62.81);Pain Pain - Right/Left:  (back)    Time: 4801-6553 PT Time Calculation (min) (ACUTE ONLY): 38 min   Charges:   PT Evaluation $PT Eval Moderate Complexity: 1 Mod PT Treatments $Therapeutic Activity: 8-22 mins        Arlyss Gandy, PT, DPT Acute Rehabilitation Pager: [phone]   Arlyss Gandy 11/23/2019, 5:05 PM

## 2019-11-23 NOTE — Progress Notes (Signed)
Orthopedic Tech Progress Note Patient Details:  Logan Berg [birth year] [id number]  Ortho Devices Type of Ortho Device: Arm sling Ortho Device/Splint Location: RUE Ortho Device/Splint Interventions: Ordered, Application   Post Interventions Patient Tolerated: Well Instructions Provided: Care of device   Donald Pore 11/23/2019, 12:49 PM

## 2019-11-23 NOTE — Op Note (Signed)
Right Chest Tube Insertion Procedure Note (14 Fr pigtail)  Indications:  Clinically significant Pneumothorax on right  Pre-operative Diagnosis: Pneumothorax  Post-operative Diagnosis: Pneumothorax  Procedure Details  Informed consent was obtained for the procedure, including sedation.  Risks of lung perforation, hemorrhage, arrhythmia, and adverse drug reaction were discussed.   After sterile skin prep, using seldinger technique, a 14 French tube was placed in the right anterolateral 5th rib space.  Findings: Rush of air  Estimated Blood Loss:  Minimal         Specimens:  None              Complications:  None; patient tolerated the procedure well.         Disposition: remains in his bed in the ED         Condition: stable  Attending Attestation: I performed the procedure.
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
## 2019-11-23 NOTE — Progress Notes (Signed)
Comminuted R Scapula fx - have placed consult to Dr. Johnnye Lana, M.D. [facility] Use [email or web address] to contact on call provider

## 2019-11-24 ENCOUNTER — Inpatient Hospital Stay (HOSPITAL_COMMUNITY): Payer: Medicare Other

## 2019-11-24 LAB — COMPREHENSIVE METABOLIC PANEL
ALT: 18 U/L (ref 0–44)
AST: 28 U/L (ref 15–41)
Albumin: 3.6 g/dL (ref 3.5–5.0)
Alkaline Phosphatase: 49 U/L (ref 38–126)
Anion gap: 12 (ref 5–15)
BUN: 25 mg/dL — ABNORMAL HIGH (ref 8–23)
CO2: 25 mmol/L (ref 22–32)
Calcium: 9.1 mg/dL (ref 8.9–10.3)
Chloride: 104 mmol/L (ref 98–111)
Creatinine, Ser: 1.05 mg/dL (ref 0.61–1.24)
GFR calc Af Amer: >60 mL/min (ref >60)
GFR calc non Af Amer: >60 mL/min (ref >60)
Glucose, Bld: 118 mg/dL — ABNORMAL HIGH (ref 70–99)
Potassium: 4.1 mmol/L (ref 3.5–5.1)
Sodium: 141 mmol/L (ref 135–145)
Total Bilirubin: 0.9 mg/dL (ref 0.3–1.2)
Total Protein: 6.5 g/dL (ref 6.5–8.1)

## 2019-11-24 LAB — CBC
HCT: 31.9 % — ABNORMAL LOW (ref 39.0–52.0)
Hemoglobin: 10.9 g/dL — ABNORMAL LOW (ref 13.0–17.0)
MCH: 33 pg (ref 26.0–34.0)
MCHC: 34.2 g/dL (ref 30.0–36.0)
MCV: 96.7 fL (ref 80.0–100.0)
Platelets: 119 10*3/uL — ABNORMAL LOW (ref 150–400)
RBC: 3.3 MIL/uL — ABNORMAL LOW (ref 4.22–5.81)
RDW: 12.6 % (ref 11.5–15.5)
WBC: 17.8 10*3/uL — ABNORMAL HIGH (ref 4.0–10.5)
nRBC: 0 % (ref 0.0–0.2)

## 2019-11-24 LAB — PROTIME-INR
INR: 1.2 (ref 0.8–1.2)
Prothrombin Time: 14.7 seconds (ref 11.4–15.2)

## 2019-11-24 MED ORDER — TRAMADOL HCL 50 MG PO TABS
100.0000 mg | ORAL_TABLET | Freq: Four times a day (QID) | ORAL | Status: DC
  Administered 2019-11-24 – 2019-11-25 (×4): 100 mg via ORAL
  Filled 2019-11-24 (×4): qty 2
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
## 2019-11-25 ENCOUNTER — Inpatient Hospital Stay (HOSPITAL_COMMUNITY): Payer: Medicare Other

## 2019-11-25 LAB — COMPREHENSIVE METABOLIC PANEL
ALT: 19 U/L (ref 0–44)
AST: 28 U/L (ref 15–41)
Albumin: 3.2 g/dL — ABNORMAL LOW (ref 3.5–5.0)
Alkaline Phosphatase: 48 U/L (ref 38–126)
Anion gap: 10 (ref 5–15)
BUN: 26 mg/dL — ABNORMAL HIGH (ref 8–23)
CO2: 26 mmol/L (ref 22–32)
Calcium: 9 mg/dL (ref 8.9–10.3)
Chloride: 105 mmol/L (ref 98–111)
Creatinine, Ser: 1.03 mg/dL (ref 0.61–1.24)
GFR calc Af Amer: >60 mL/min (ref >60)
GFR calc non Af Amer: >60 mL/min (ref >60)
Glucose, Bld: 100 mg/dL — ABNORMAL HIGH (ref 70–99)
Potassium: 4.3 mmol/L (ref 3.5–5.1)
Sodium: 141 mmol/L (ref 135–145)
Total Bilirubin: 0.7 mg/dL (ref 0.3–1.2)
Total Protein: 6 g/dL — ABNORMAL LOW (ref 6.5–8.1)

## 2019-11-25 LAB — PROTIME-INR
INR: 1.1 (ref 0.8–1.2)
Prothrombin Time: 13.7 seconds (ref 11.4–15.2)

## 2019-11-25 LAB — CBC
HCT: 29.5 % — ABNORMAL LOW (ref 39.0–52.0)
Hemoglobin: 9.5 g/dL — ABNORMAL LOW (ref 13.0–17.0)
MCH: 32.2 pg (ref 26.0–34.0)
MCHC: 32.2 g/dL (ref 30.0–36.0)
MCV: 100 fL (ref 80.0–100.0)
Platelets: 94 10*3/uL — ABNORMAL LOW (ref 150–400)
RBC: 2.95 MIL/uL — ABNORMAL LOW (ref 4.22–5.81)
RDW: 12.6 % (ref 11.5–15.5)
WBC: 12.4 10*3/uL — ABNORMAL HIGH (ref 4.0–10.5)
nRBC: 0 % (ref 0.0–0.2)

## 2019-11-25 MED ORDER — TRAMADOL HCL 50 MG PO TABS
50.0000 mg | ORAL_TABLET | Freq: Four times a day (QID) | ORAL | Status: DC
  Administered 2019-11-25 – 2019-11-28 (×11): 50 mg via ORAL
  Filled 2019-11-25 (×12): qty 1

## 2019-11-25 MED ORDER — ASPIRIN-ACETAMINOPHEN-CAFFEINE 250-250-65 MG PO TABS
1.0000 | ORAL_TABLET | Freq: Four times a day (QID) | ORAL | Status: DC | PRN
  Administered 2019-11-25 – 2019-11-27 (×5): 1 via ORAL
  Filled 2019-11-25 (×6): qty 1

## 2019-11-25 MED ORDER — METHADONE HCL 10 MG PO TABS
95.0000 mg | ORAL_TABLET | Freq: Every day | ORAL | Status: DC
  Administered 2019-11-26 – 2019-11-28 (×3): 95 mg via ORAL
  Filled 2019-11-25 (×3): qty 10
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
## 2019-11-25 NOTE — TOC CAGE-AID Note (Signed)
Transition of Care [facility]) - CAGE-AID Screening   Patient Details  Name: Logan Berg MRN: [id number] Date of Birth: 1950-09-27  Transition of Care [facility]) CM/SW Contact:    Emeterio Reeve, [place] Phone Number: 11/25/2019, 4:33 PM   Clinical Narrative:  CSW met with pt at bedside. CSW introduced self and explained her role at the hospital.  Pt denied alcohol use. Pt reports that he gets methadone from [facility]. Pt reports he has been on methadone for 6-7 years. Pt reports that he no longer uses any substances. Pt was receptive to counseling and denied resources.   CAGE-AID Screening:    Have You Ever Felt You Ought to Cut Down on Your Drinking or Drug Use?: No Have People Annoyed You By Critizing Your Drinking Or Drug Use?: No Have You Felt Bad Or Guilty About Your Drinking Or Drug Use?: No Have You Ever Had a Drink or Used Drugs First Thing In The Morning to Steady Your Nerves or to Get Rid of a Hangover?: No CAGE-AID Score: 0  Substance Abuse Education Offered: Yes  Substance abuse interventions: Patient Counseling    Emeterio Reeve, Latanya Presser, [place] Social Worker [phone]

## 2019-11-25 NOTE — Discharge Instructions (Signed)
Do not bear weight with your right arm for 4 weeks.  Wear your sling until you see Dr. Aundria Berg in 2 weeks  Zygoma Fracture  A zygoma fracture is a break in one of the bones in the face. The zygoma forms the part of your cheekbone that you can feel under your eye. The main part of the zygoma meets the bone that forms the middle part of your face (maxillary bone) under your eye. The zygoma also has an arched part that extends along the side of your face to meet the bone that forms the side of your head (temporalbone).  A zygoma fracture may involve the main part of the bone, the arch of the bone, or both parts of the bone. What are the causes? This condition may be caused by an injury or trauma to the face, which can happen from:  A car accident.  A direct blow to the face.  A sports injury.  A fall. What increases the risk? You are more likely to develop this condition if:  You play contact sports.  You are a victim of violence or participate in violent activities or behaviors. What are the signs or symptoms? Symptoms of this condition include:  Swelling.  Bruising.  Pain.  Difficulty or pain when chewing.  A feeling that your teeth are out of line. As the swelling goes down, your face may look different because your cheekbone is flat or set back (depressed). If the fracture extends into bones that support your eye (blowout fracture), you may have double vision or numbness in your cheek. How is this diagnosed? This condition may be diagnosed based on:  Your symptoms and description of the injury.  A physical exam. Your health care provider will check your cheekbone area and feel whether your zygoma is depressed or separated.  Tests to confirm the diagnosis and check for other injuries. These tests may include: ? X-rays. ? CT scan. ? Eye exam. How is this treated? Treatment depends on the type of fracture you have and how severe it is. You may have to wait for treatment  until the swelling and inflammation decrease. This injury may be treated with:  Rest. If you have a fracture that does not cause any deformity or change in your chewing (non-displaced fracture), you may not need treatment.  Taking medicine for pain.  Surgery. You may need surgery if you have trouble opening your mouth or if you have a cheekbone deformity (displaced fracture). Surgery may involve either of the following: ? A closed reduction. A small incision is made inside your mouth or on the side of your head. The surgeon inserts a smooth, blunt surgical instrument through the incision to lift the bone back into proper position. ? An open reduction. This may require a small incision over the fracture site. The fracture is put back into proper position. It will be held in place with wires or with screws and metal plates. Follow these instructions at home: Activity  Rest as told by your health care provider.  Avoid lying down on your injured side. It may help to sleep in a sitting position. Try sleeping in a reclining chair or propping yourself up with extra pillows in bed.  Do not participate in activities that put you at risk for injuring the area again.  Wear protective gear as told by your health care provider, especially when participating in sports or activities that put you at risk for re-injury. Medicines  Take over-the-counter  and prescription medicines only as told by your health care provider.  Do not drive or use heavy machinery while taking prescription pain medicine.  If you are taking prescription pain medicine, take actions to prevent or treat constipation. Your health care provider may recommend that you: ? Drink enough fluid to keep your urine pale yellow. ? Eat foods that are high in fiber, such as fresh fruits and vegetables, whole grains, and beans. ? Limit foods that are high in fat and processed sugars, such as fried or sweet foods. ? Take an over-the-counter or  prescription medicine for constipation. General instructions   Avoid blowing your nose.  Wash and dry your face gently.  Do not use any products that contain nicotine or tobacco, such as cigarettes and e-cigarettes. These can delay bone healing. If you need help quitting, ask your health care provider.  Follow instructions from your health care provider about eating or drinking restrictions. Eat a soft or liquid diet until your health care provider says it is okay for you to chew.  If directed, put ice on the injured area. ? Put ice in a plastic bag. ? Place a towel between your skin and the bag. ? Leave the ice on for 20 minutes, 2-3 times a day.  Keep all follow-up visits as told by your health care provider. This is important. Contact a health care provider if:  Pain or inflammation does not decrease with medicines.  Swelling or bruising of the injured area gets worse.  You develop any vision problems.  You have a lot of clear watery discharge from your nose.  You have a fever.  You have nausea or vomiting. Get help right away if you:  Have increased trouble moving your mouth.  Have trouble breathing or swallowing.  Develop a severe headache. Summary  A zygoma fracture is a break in one of the bones in the face. The zygoma forms the part of your cheekbone that you can feel under your eye.  This condition may be caused by an injury or trauma to the face.  Avoid blowing your nose and lying down on your injured side.  Treatment depends on the type of fracture you have and how severe it is. You may have to wait for treatment until the swelling and inflammation decrease. This information is not intended to replace advice given to you by your health care provider. Make sure you discuss any questions you have with your health care provider. Document Revised: 06/30/2017 Document Reviewed: 03/25/2017 Elsevier Patient Education  2020 [employer]   Rib Fracture  A rib  fracture is a break or crack in one of the bones of the ribs. The ribs are like a cage that goes around your upper chest. A broken or cracked rib is often painful, but most do not cause other problems. Most rib fractures usually heal on their own in 1-3 months. Follow these instructions at home: Managing pain, stiffness, and swelling  If directed, apply ice to the injured area. ? Put ice in a plastic bag. ? Place a towel between your skin and the bag. ? Leave the ice on for 20 minutes, 2-3 times a day.  Take over-the-counter and prescription medicines only as told by your doctor. Activity  Avoid activities that cause pain to the injured area. Protect your injured area.  Slowly increase activity as told by your doctor. General instructions  Do deep breathing as told by your doctor. You may be told to: ? Take  deep breaths many times a day. ? Cough many times a day while hugging a pillow. ? Use a device (incentive spirometer) to do deep breathing many times a day.  Drink enough fluid to keep your pee (urine) clear or pale yellow.  Do not wear a rib belt or binder. These do not allow you to breathe deeply.  Keep all follow-up visits as told by your doctor. This is important. Contact a doctor if:  You have a fever. Get help right away if:  You have trouble breathing.  You are short of breath.  You cannot stop coughing.  You cough up thick or bloody spit (sputum).  You feel sick to your stomach (nauseous), throw up (vomit), or have belly (abdominal) pain.  Your pain gets worse and medicine does not help. Summary  A rib fracture is a break or crack in one of the bones of the ribs.  Apply ice to the injured area and take medicines for pain as told by your doctor.  Take deep breaths and cough many times a day. Hug a pillow every time you cough. This information is not intended to replace advice given to you by your health care provider. Make sure you discuss any questions you  have with your health care provider. Document Revised: 03/20/2017 Document Reviewed: 07/08/2016 Elsevier Patient Education  2020 [employer]   Pneumothorax A pneumothorax is commonly called a collapsed lung. It is a condition in which air leaks from a lung and builds up between the thin layer of tissue that covers the lungs (visceral pleura) and the interior wall of the chest cavity (parietal pleura). The air gets trapped outside the lung, between the lung and the chest wall (pleural space). The air takes up space and prevents the lung from fully expanding. This condition sometimes occurs suddenly with no apparent cause. The buildup of air may be small or large. A small pneumothorax may go away on its own. A large pneumothorax will require treatment and hospitalization. What are the causes? This condition may be caused by:  Trauma and injury to the chest wall.  Surgery and other medical procedures.  A complication of an underlying lung problem, especially chronic obstructive pulmonary disease (COPD) or emphysema. Sometimes the cause of this condition is not known. What increases the risk? You are more likely to develop this condition if:  You have an underlying lung problem.  You smoke.  You are 6520-[age] years old, male, tall, and underweight.  You have a personal or family history of pneumothorax.  You have an eating disorder (anorexia nervosa). This condition can also happen quickly, even in people with no history of lung problems. What are the signs or symptoms? Sometimes a pneumothorax will have no symptoms. When symptoms are present, they can include:  Chest pain.  Shortness of breath.  Increased rate of breathing.  Bluish color to your lips or skin (cyanosis). How is this diagnosed? This condition may be diagnosed by:  A medical history and physical exam.  A chest X-ray, chest CT scan, or ultrasound. How is this treated? Treatment depends on how severe your  condition is. The goal of treatment is to remove the extra air and allow your lung to expand back to its normal size.  For a small pneumothorax: ? No treatment may be needed. ? Extra oxygen is sometimes used to make it go away more quickly.  For a large pneumothorax or a pneumothorax that is causing symptoms, a procedure is done to  drain the air from your lungs. To do this, a health care provider may use: ? A needle with a syringe. This is used to suck air from a pleural space where no additional leakage is taking place. ? A chest tube. This is used to suck air where there is ongoing leakage into the pleural space. The chest tube may need to remain in place for several days until the air leak has healed.  In more severe cases, surgery may be needed to repair the damage that is causing the leak.  If you have multiple pneumothorax episodes or have an air leak that will not heal, a procedure called a pleurodesis may be done. A medicine is placed in the pleural space to irritate the tissues around the lung so that the lung will stick to the chest wall, seal any leaks, and stop any buildup of air in that space. If you have an underlying lung problem, severe symptoms, or a large pneumothorax you will usually need to stay in the hospital. Follow these instructions at home: Lifestyle  Do not use any products that contain nicotine or tobacco, such as cigarettes and e-cigarettes. These are major risk factors in pneumothorax. If you need help quitting, ask your health care provider.  Do not lift anything that is heavier than 10 lb (4.5 kg), or the limit that your health care provider tells you, until he or she says that it is safe.  Avoid activities that take a lot of effort (strenuous) for as long as told by your health care provider.  Return to your normal activities as told by your health care provider. Ask your health care provider what activities are safe for you.  Do not fly in an airplane or  scuba dive until your health care provider says it is okay. General instructions  Take over-the-counter and prescription medicines only as told by your health care provider.  If a cough or pain makes it difficult for you to sleep at night, try sleeping in a semi-upright position in a recliner or by using 2 or 3 pillows.  If you had a chest tube and it was removed, ask your health care provider when you can remove the bandage (dressing). While the dressing is in place, do not allow it to get wet.  Keep all follow-up visits as told by your health care provider. This is important. Contact a health care provider if:  You cough up thick mucus (sputum) that is yellow or green in color.  You were treated with a chest tube, and you have redness, increasing pain, or discharge at the site where it was placed. Get help right away if:  You have increasing chest pain or shortness of breath.  You have a cough that will not go away.  You begin coughing up blood.  You have pain that is getting worse or is not controlled with medicines.  The site where your chest tube was located opens up.  You feel air coming out of the site where the chest tube was placed.  You have a fever or persistent symptoms for more than 2-3 days.  You have a fever and your symptoms suddenly get worse. These symptoms may represent a serious problem that is an emergency. Do not wait to see if the symptoms will go away. Get medical help right away. Call your local emergency services (911 in the [place] Do not drive yourself to the hospital. Summary  A pneumothorax, commonly called a collapsed lung, is  a condition in which air leaks from a lung and gets trapped between the lung and the chest wall (pleural space).  The buildup of air may be small or large. A small pneumothorax may go away on its own. A large pneumothorax will require treatment and hospitalization.  Treatment for this condition depends on how severe the  pneumothorax is. The goal of treatment is to remove the extra air and allow the lung to expand back to its normal size. This information is not intended to replace advice given to you by your health care provider. Make sure you discuss any questions you have with your health care provider. Document Revised: 03/20/2017 Document Reviewed: 03/16/2017 Elsevier Patient Education  2020 [employer]   Scapular Fracture  A scapular fracture is a break in the large, triangular bone behind your shoulder (shoulder blade or scapula). This bone makes up the socket joint of your shoulder. The scapula is well protected by muscles, so scapular fractures are unusual injuries. They often involve a lot of force. People who have a scapular fracture often have other injuries as well. These may be injuries to the lung, spine, head, shoulder, or ribs. What are the causes? Common causes of this condition include:  A fall from a great height.  A car or motorcycle accident.  A heavy, direct blow to the scapula. What are the signs or symptoms? The main symptom of a scapular fracture is severe pain when you try to move your arm. Other signs and symptoms include:  Swelling behind the shoulder.  Bruising.  Holding the arm still and close to the body. How is this diagnosed? This condition may be diagnosed based on:  Your symptoms and the details of a recent injury.  A physical exam.  X-ray or CT scan to confirm the diagnosis and to check for other injuries. How is this treated? This condition may be treated with:  Immobilization. Your arm is put in a sling. A support bandage may be wrapped around your chest. The health care provider will explain how to move your shoulder for the first week after your injury in order to prevent pain and stiffness. The sling can be removed as your movement increases and your pain decreases.  Physical therapy. A physical therapist will teach you exercises to stretch and  strengthen your shoulder. The goal is to keep your shoulder from getting stiff or frozen. You may need to do these exercises for 6-12 months.  Surgery. You may need surgery if the bone pieces are out of place (displaced fracture). You may also need surgery if the fracture causes the bone to be deformed. In this case, the broken scapula will be put back into position and held in place with a surgical plate and screws. Surgery is rarely done for this condition. Follow these instructions at home:  Medicines  Take over-the-counter and prescription medicines only as told by your health care provider.  Do not drive or use heavy machinery while taking prescription pain medicine. If you have a splint and a wrap:  Wear the splint and the wrap as told by your health care provider. Remove them only as told by your health care provider.  Loosen them if your fingers or toes tingle, become numb, or turn cold and blue.  Keep them clean.  If they are not waterproof: ? Do not let them get wet. ? Cover them with a watertight covering when you take a bath or a shower. Managing pain, stiffness, and swelling  Apply ice to the back of your shoulder: ? If you have a removable splint or wrap, remove it as told by your health care provider. ? Put ice in a plastic bag. ? Place a towel between your skin and the bag. ? Leave the ice on for 20 minutes, 2-3 times per day.  Do not lift anything that is heavier than 10 lbs. (4.5 kg), or the limit that your health care provider tells you, until he or she says that it is safe.  Avoid activities that make your symptoms worse for 4-6 weeks, or as long as directed. General instructions  Ask your health care provider when it is safe for you to drive.  Do not use any products that contain nicotine or tobacco, such as cigarettes and e-cigarettes. These can delay bone healing. If you need help quitting, ask your health care provider.  Drink enough fluid to keep your  urine pale yellow.  Do physical therapy exercises as told by your health care provider.  Return to your normal activities as told by your health care provider. Ask your health care provider what activities are safe for you.  Keep all follow-up visits as told by your health care provider. This is important. Contact a health care provider if:  You have pain that is not relieved by medicine.  You are unable to do your physical therapy because of pain or stiffness. Get help right away if:  You are short of breath.  You cough up blood.  You cannot move your arm or your fingers. Summary  A scapular fracture is a break in the large, triangular bone behind your shoulder (shoulder blade or scapula).  The scapula is well protected by muscles, so scapular fractures are unusual injuries. They often involve a lot of force.  The main symptom of a scapular fracture is severe pain when you try to move your arm.  Immobilization, physical therapy, and surgery are used to treat this injury. Surgery is rarely done.  Follow your health care provider's instructions on taking medicines, using a wrap and splint, putting ice on the injured area, and resting from regular activities. This information is not intended to replace advice given to you by your health care provider. Make sure you discuss any questions you have with your health care provider. Document Revised: 06/19/2017 Document Reviewed: 05/19/2017 Elsevier Patient Education  2020 [employer].   How To Use a Sling A sling is a type of hanging bandage. You wear it around your neck to protect an injured arm, shoulder, or other body part. You may need to wear a sling so that your injured body part does not move (is immobilized) while it heals. Keeping the injured part of your body still can lessen pain and speed up healing. Your doctor may suggest that you use a sling if you have:  A broken arm.  A broken collarbone.  A shoulder  injury.  Surgery. What are the risks? Wearing a sling is safe. In some cases, wearing a sling the wrong way can:  Make your injury worse.  Cause stiffness or loss of feeling (numbness).  Affect blood flow (circulation) in your arm and hand. This can cause tingling or loss of feeling in your fingers or hands. How to use a sling Follow instructions from your doctor about how and when to wear your sling. Your doctor will show you or tell you:  How to put on the sling.  How to adjust the sling.  When  and how often to wear the sling.  How to take off the sling. The way that you use a sling depends on your injury. Follow these instructions (unless your doctor tells you other instructions):  Wear the sling so that your elbow bends to the shape of a capital letter "L" (at a 90-degree angle, also called a right angle).  Make sure the sling supports your wrist and your hand.  Adjust the sling if your fingers or hand start to tingle or lose feeling. Follow these instructions at home:  Try to not move your arm.  Do not twist, lift, or move your arm in a way that could make your injury worse.  Do not lean on your arm while you have to wear a sling.  Do not lift anything while you have to wear a sling. Contact a doctor if:  You have: ? Bruising, swelling, or pain that gets worse. ? Pain that does not get better with medicine. ? A fever.  Your sling: ? Does not support your arm like it should. ? Gets damaged. Get help right away if:  You lose feeling in your fingers.  Your fingers: ? Are tingling. ? Turn blue. ? Feel cold to the touch.  You cannot control the bleeding from your injury.  You have shortness of breath. Summary  A sling is a type of hanging bandage. You wear it around your neck to protect an injured arm, shoulder, or other body part.  You may need to wear a sling so that your injured body part does not move (is immobilized) while it heals.  The way that  you use a sling depends on your injury. Follow instructions from your doctor about how and when to wear your sling.  In general, you should wear the sling so that your elbow bends to the shape of a capital letter "L." This information is not intended to replace advice given to you by your health care provider. Make sure you discuss any questions you have with your health care provider. Document Revised: 03/20/2017 Document Reviewed: 02/26/2017 Elsevier Patient Education  2020 [employer].
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
## 2019-11-25 NOTE — Progress Notes (Signed)
Chest tube removed by Mayford Knife, RN, per order.  Robina Ade, RN
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
## 2019-11-26 ENCOUNTER — Inpatient Hospital Stay (HOSPITAL_COMMUNITY): Payer: Medicare Other

## 2019-11-26 LAB — COMPREHENSIVE METABOLIC PANEL
ALT: 17 U/L (ref 0–44)
AST: 24 U/L (ref 15–41)
Albumin: 2.9 g/dL — ABNORMAL LOW (ref 3.5–5.0)
Alkaline Phosphatase: 47 U/L (ref 38–126)
Anion gap: 6 (ref 5–15)
BUN: 22 mg/dL (ref 8–23)
CO2: 29 mmol/L (ref 22–32)
Calcium: 8.7 mg/dL — ABNORMAL LOW (ref 8.9–10.3)
Chloride: 103 mmol/L (ref 98–111)
Creatinine, Ser: 0.96 mg/dL (ref 0.61–1.24)
GFR calc Af Amer: >60 mL/min (ref >60)
GFR calc non Af Amer: >60 mL/min (ref >60)
Glucose, Bld: 91 mg/dL (ref 70–99)
Potassium: 3.5 mmol/L (ref 3.5–5.1)
Sodium: 138 mmol/L (ref 135–145)
Total Bilirubin: 0.7 mg/dL (ref 0.3–1.2)
Total Protein: 5.9 g/dL — ABNORMAL LOW (ref 6.5–8.1)

## 2019-11-26 LAB — CBC
HCT: 28.1 % — ABNORMAL LOW (ref 39.0–52.0)
Hemoglobin: 9.2 g/dL — ABNORMAL LOW (ref 13.0–17.0)
MCH: 31.9 pg (ref 26.0–34.0)
MCHC: 32.7 g/dL (ref 30.0–36.0)
MCV: 97.6 fL (ref 80.0–100.0)
Platelets: 101 10*3/uL — ABNORMAL LOW (ref 150–400)
RBC: 2.88 MIL/uL — ABNORMAL LOW (ref 4.22–5.81)
RDW: 12.2 % (ref 11.5–15.5)
WBC: 8 10*3/uL (ref 4.0–10.5)
nRBC: 0 % (ref 0.0–0.2)

## 2019-11-26 LAB — PROTIME-INR
INR: 1.2 (ref 0.8–1.2)
Prothrombin Time: 14.6 seconds (ref 11.4–15.2)

## 2019-11-26 MED ORDER — OXYCODONE HCL 5 MG PO TABS
5.0000 mg | ORAL_TABLET | ORAL | Status: DC | PRN
  Administered 2019-11-26 – 2019-11-28 (×5): 10 mg via ORAL
  Filled 2019-11-26 (×4): qty 2

## 2019-11-26 MED ORDER — GUAIFENESIN ER 600 MG PO TB12: 600.0000 mg | ORAL_TABLET | Freq: Two times a day (BID) | ORAL | Status: DC | PRN

## 2019-11-26 MED ORDER — HYDROMORPHONE HCL 1 MG/ML IJ SOLN
0.5000 mg | INTRAMUSCULAR | Status: DC | PRN
  Administered 2019-11-26 – 2019-11-27 (×2): 2 mg via INTRAVENOUS
  Filled 2019-11-26 (×2): qty 2
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
## 2019-11-26 NOTE — Progress Notes (Signed)
Pt continues to have severe pain throughout shift despite increase in Methadone dose to 95 mg. Pt was also administered Tramadol, Oxycodone, and Excedrin and had short-lasting intermittent relief. Pt continues to refuse Robaxin as he says that it makes him nauseous.   Robina Ade, RN
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
## 2019-11-26 NOTE — Progress Notes (Signed)
Patient continually to have a headache and complained with neck pain.  After repositioning in the bed and  prn po pain med given,  [place] IV Dilaudid given.  Patient states he is unable to take the Robaxin because this will hurt his stomach and  the OxyContin.  However, RN premedicated with Zofran  before giving  OxyContin patient stated a little relief only that both(neck and head) pains together are a 10!

## 2019-11-27 LAB — CBC
HCT: 27.8 % — ABNORMAL LOW (ref 39.0–52.0)
Hemoglobin: 9.4 g/dL — ABNORMAL LOW (ref 13.0–17.0)
MCH: 32.6 pg (ref 26.0–34.0)
MCHC: 33.8 g/dL (ref 30.0–36.0)
MCV: 96.5 fL (ref 80.0–100.0)
Platelets: 118 10*3/uL — ABNORMAL LOW (ref 150–400)
RBC: 2.88 MIL/uL — ABNORMAL LOW (ref 4.22–5.81)
RDW: 12 % (ref 11.5–15.5)
WBC: 7 10*3/uL (ref 4.0–10.5)
nRBC: 0 % (ref 0.0–0.2)

## 2019-11-27 LAB — PROTIME-INR
INR: 1.2 (ref 0.8–1.2)
Prothrombin Time: 14.5 seconds (ref 11.4–15.2)

## 2019-11-27 LAB — COMPREHENSIVE METABOLIC PANEL
ALT: 17 U/L (ref 0–44)
AST: 21 U/L (ref 15–41)
Albumin: 2.9 g/dL — ABNORMAL LOW (ref 3.5–5.0)
Alkaline Phosphatase: 46 U/L (ref 38–126)
Anion gap: 8 (ref 5–15)
BUN: 19 mg/dL (ref 8–23)
CO2: 29 mmol/L (ref 22–32)
Calcium: 8.7 mg/dL — ABNORMAL LOW (ref 8.9–10.3)
Chloride: 103 mmol/L (ref 98–111)
Creatinine, Ser: 0.96 mg/dL (ref 0.61–1.24)
GFR calc Af Amer: >60 mL/min (ref >60)
GFR calc non Af Amer: >60 mL/min (ref >60)
Glucose, Bld: 92 mg/dL (ref 70–99)
Potassium: 3.7 mmol/L (ref 3.5–5.1)
Sodium: 140 mmol/L (ref 135–145)
Total Bilirubin: 0.7 mg/dL (ref 0.3–1.2)
Total Protein: 5.6 g/dL — ABNORMAL LOW (ref 6.5–8.1)
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
## 2019-11-27 NOTE — Plan of Care (Signed)

## 2019-11-28 ENCOUNTER — Inpatient Hospital Stay (HOSPITAL_COMMUNITY): Payer: Medicare Other

## 2019-11-28 LAB — CBC
HCT: 28.7 % — ABNORMAL LOW (ref 39.0–52.0)
Hemoglobin: 9.9 g/dL — ABNORMAL LOW (ref 13.0–17.0)
MCH: 33.6 pg (ref 26.0–34.0)
MCHC: 34.5 g/dL (ref 30.0–36.0)
MCV: 97.3 fL (ref 80.0–100.0)
Platelets: 140 10*3/uL — ABNORMAL LOW (ref 150–400)
RBC: 2.95 MIL/uL — ABNORMAL LOW (ref 4.22–5.81)
RDW: 12.1 % (ref 11.5–15.5)
WBC: 8.1 10*3/uL (ref 4.0–10.5)
nRBC: 0 % (ref 0.0–0.2)

## 2019-11-28 LAB — BASIC METABOLIC PANEL
Anion gap: 8 (ref 5–15)
BUN: 18 mg/dL (ref 8–23)
CO2: 29 mmol/L (ref 22–32)
Calcium: 8.8 mg/dL — ABNORMAL LOW (ref 8.9–10.3)
Chloride: 103 mmol/L (ref 98–111)
Creatinine, Ser: 0.97 mg/dL (ref 0.61–1.24)
GFR calc Af Amer: >60 mL/min (ref >60)
GFR calc non Af Amer: >60 mL/min (ref >60)
Glucose, Bld: 103 mg/dL — ABNORMAL HIGH (ref 70–99)
Potassium: 3.6 mmol/L (ref 3.5–5.1)
Sodium: 140 mmol/L (ref 135–145)

## 2019-11-28 MED ORDER — LEVETIRACETAM 500 MG PO TABS: 500.0000 mg | ORAL_TABLET | Freq: Two times a day (BID) | ORAL | 0 refills | Status: AC

## 2019-11-28 MED ORDER — TRAMADOL HCL 50 MG PO TABS: 50.0000 mg | ORAL_TABLET | Freq: Four times a day (QID) | ORAL | 0 refills | Status: AC

## 2019-11-28 MED ORDER — OXYCODONE HCL 10 MG PO TABS: 5.0000 mg | ORAL_TABLET | ORAL | 0 refills | Status: AC | PRN

## 2019-11-28 MED ORDER — ASPIRIN-ACETAMINOPHEN-CAFFEINE 250-250-65 MG PO TABS: 1.0000 | ORAL_TABLET | Freq: Four times a day (QID) | ORAL | Status: AC | PRN

## 2019-11-28 MED ORDER — METHOCARBAMOL 500 MG PO TABS: 1000.0000 mg | ORAL_TABLET | Freq: Three times a day (TID) | ORAL | 0 refills | Status: AC
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
## 2019-11-28 NOTE — Progress Notes (Signed)
PT Cancellation Note  Patient Details Name: Logan Berg MRN: [id number] DOB: [birth year]   Cancelled Treatment:    Reason Eval/Treat Not Completed: Other (comment) Pt with c/o R ankle pain.  Noted + edema.  Pt has not had imaging of ankle after [facility] and is asking about xray.  Notified RN who reports will contact MD. Will f/u as able. Anise Salvo, PT Acute Rehab Services Pager [phone] [facility] [phone]    Rayetta Humphrey 11/28/2019, 10:41 AM

## 2019-11-28 NOTE — Plan of Care (Signed)
Going home with home health 

## 2019-11-28 NOTE — Progress Notes (Signed)
Pt report increased pain in right ankle. This nurse assisted pt to ambulate to the bathroom and pt had a difficult time walking to the bathroom. Will report to pts nurse for today. Mayford Knife RN
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
## 2019-12-06 ENCOUNTER — Ambulatory Visit
Admission: RE | Admit: 2019-12-06 | Discharge: 2019-12-06 | Disposition: A | Discharge status: Home / Self Care | Payer: Medicare Other | Source: Ambulatory Visit | Attending: General Surgery | Admitting: General Surgery

## 2019-12-06 ENCOUNTER — Other Ambulatory Visit: Payer: Self-pay

## 2019-12-06 ENCOUNTER — Other Ambulatory Visit: Payer: Self-pay | Admitting: General Surgery

## 2019-12-06 DIAGNOSIS — J9383 Other pneumothorax: Secondary | ICD-10-CM
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
